# Patient Record
Sex: Female | Born: 1955 | Hispanic: Yes | Marital: Married | Smoking: Never smoker
Health system: Southern US, Community
[De-identification: ages and names within clinical notes are randomized; demographics above are authoritative.]

---

## 2008-06-24 ENCOUNTER — Ambulatory Visit: Payer: Self-pay

## 2009-09-24 ENCOUNTER — Ambulatory Visit: Payer: Self-pay | Admitting: Family Medicine

## 2010-12-20 ENCOUNTER — Ambulatory Visit: Payer: Self-pay | Admitting: Family Medicine

## 2012-03-28 ENCOUNTER — Ambulatory Visit: Payer: Self-pay | Admitting: Family Medicine

## 2013-09-08 ENCOUNTER — Ambulatory Visit: Payer: Self-pay

## 2015-05-19 ENCOUNTER — Ambulatory Visit: Payer: Self-pay | Attending: Oncology

## 2015-05-19 ENCOUNTER — Ambulatory Visit
Admission: RE | Admit: 2015-05-19 | Discharge: 2015-05-19 | Disposition: A | Discharge status: Home / Self Care | Payer: Self-pay | Source: Ambulatory Visit | Attending: Oncology | Admitting: Oncology

## 2015-05-19 VITALS — BP 121/78 | HR 64 | Temp 98.4°F | Ht 62.0 in | Wt 141.0 lb

## 2015-05-19 DIAGNOSIS — Z0000 Encounter for general adult medical examination without abnormal findings: Secondary | ICD-10-CM

## 2015-05-24 LAB — PAP LB AND HPV HIGH-RISK: PAP SMEAR COMMENT: 0

## 2015-05-24 LAB — HPV, LOW VOLUME (REFLEX): HPV, LOW VOL REFLEX: NEGATIVE

## 2015-06-13 NOTE — Progress Notes (Signed)
Letter mailed to patient to notify of normal mammogram, and pap smear results.Patient to return in one year for annual screening.  Copy to [facility]. 

## 2015-06-13 NOTE — Progress Notes (Signed)
Subjective:     Patient ID: Judith Hodges, female   DOB: Aug 05, 1956, 59 y.o.   MRN: [id number]  HPI   Review of Systems     Objective:   Physical Exam  Pulmonary/Chest: Right breast exhibits no inverted nipple, no mass, no nipple discharge, no skin change and no tenderness. Left breast exhibits no inverted nipple, no mass, no nipple discharge, no skin change and no tenderness. Breasts are symmetrical.  Genitourinary: No labial fusion. There is no rash, tenderness, lesion or injury on the right labia. There is no rash, tenderness, lesion or injury on the left labia. Uterus is not deviated, not enlarged, not fixed and not tender. Cervix exhibits no motion tenderness, no discharge and no friability. Right adnexum displays no mass, no tenderness and no fullness. Left adnexum displays no mass, no tenderness and no fullness. No erythema, tenderness or bleeding in the vagina. No foreign body around the vagina. No signs of injury around the vagina. No vaginal discharge found.       Assessment:     59 year old hispanic patient presents for [facility] clinic visitPatient screened, and meets B[facility] eligibility.  Patient does not have insurance, Medicare or Medicaid.  Handout given on Affordable Care Act.CBE unremarkable.  Instructed patient on breast self-exam using teach back method. Pelvic exam normal.    Plan:     Sent for bilateral screening mammogram.  Specimen collected for pap.

## 2015-08-09 DIAGNOSIS — M1812 Unilateral primary osteoarthritis of first carpometacarpal joint, left hand: Secondary | ICD-10-CM | POA: Insufficient documentation

## 2015-08-09 DIAGNOSIS — M65312 Trigger thumb, left thumb: Secondary | ICD-10-CM | POA: Insufficient documentation

## 2015-11-11 ENCOUNTER — Ambulatory Visit (HOSPITAL_BASED_OUTPATIENT_CLINIC_OR_DEPARTMENT_OTHER): Admit: 2015-11-11 | Payer: Self-pay | Admitting: Orthopedic Surgery

## 2015-11-11 ENCOUNTER — Encounter (HOSPITAL_BASED_OUTPATIENT_CLINIC_OR_DEPARTMENT_OTHER): Payer: Self-pay

## 2015-11-11 SURGERY — RELEASE, A1 PULLEY, FOR TRIGGER FINGER
Anesthesia: Regional | Site: Thumb | Laterality: Left

## 2016-08-09 ENCOUNTER — Ambulatory Visit
Admission: RE | Admit: 2016-08-09 | Discharge: 2016-08-09 | Disposition: A | Discharge status: Home / Self Care | Payer: Self-pay | Source: Ambulatory Visit | Attending: Oncology | Admitting: Oncology

## 2016-08-09 ENCOUNTER — Ambulatory Visit: Payer: Self-pay | Attending: Oncology

## 2016-08-09 VITALS — BP 105/65 | HR 72 | Temp 96.7°F | Resp 18 | Ht 62.99 in | Wt 141.1 lb

## 2016-08-09 DIAGNOSIS — Z0000 Encounter for general adult medical examination without abnormal findings: Secondary | ICD-10-CM

## 2016-08-09 NOTE — Progress Notes (Signed)
Subjective:     Patient ID: Judith Hodges, female   DOB: August 20, 1956, 60 y.o.   MRN: [id number]  HPI   Review of Systems     Objective:   Physical Exam  Pulmonary/Chest: Right breast exhibits no inverted nipple, no mass, no nipple discharge, no skin change and no tenderness. Left breast exhibits no inverted nipple, no mass, no nipple discharge, no skin change and no tenderness. Breasts are symmetrical.       Assessment:    60 year old hispanic patient presents for [facility] clinic visit.  Patient screened, and meets [facility] eligibility.  Patient does not have insurance, Medicare or Medicaid.  Handout given on Affordable Care Act.  Instructed patient on breast self-exam using teach back method.  CBE unremarkable.  No mass or lump palpated.  Judith Hodges interpreted exam.    Plan:     Sent for bilateral screening mammogram.

## 2016-08-11 NOTE — Progress Notes (Signed)
[facility] notify of normal mamm[facility] results.  Patient to return in one year for annual screening.  Copy to [facility].Copy to [facility].

## 2016-10-30 IMAGING — MG MM DIGITAL SCREENING BILAT W/ CAD
4 series · 4 of 4 positions shown · non-contrast
Comparison: Previous exam(s).

CLINICAL DATA: Screening.

EXAM:
DIGITAL SCREENING BILATERAL MAMMOGRAM WITH CAD

[R CC]
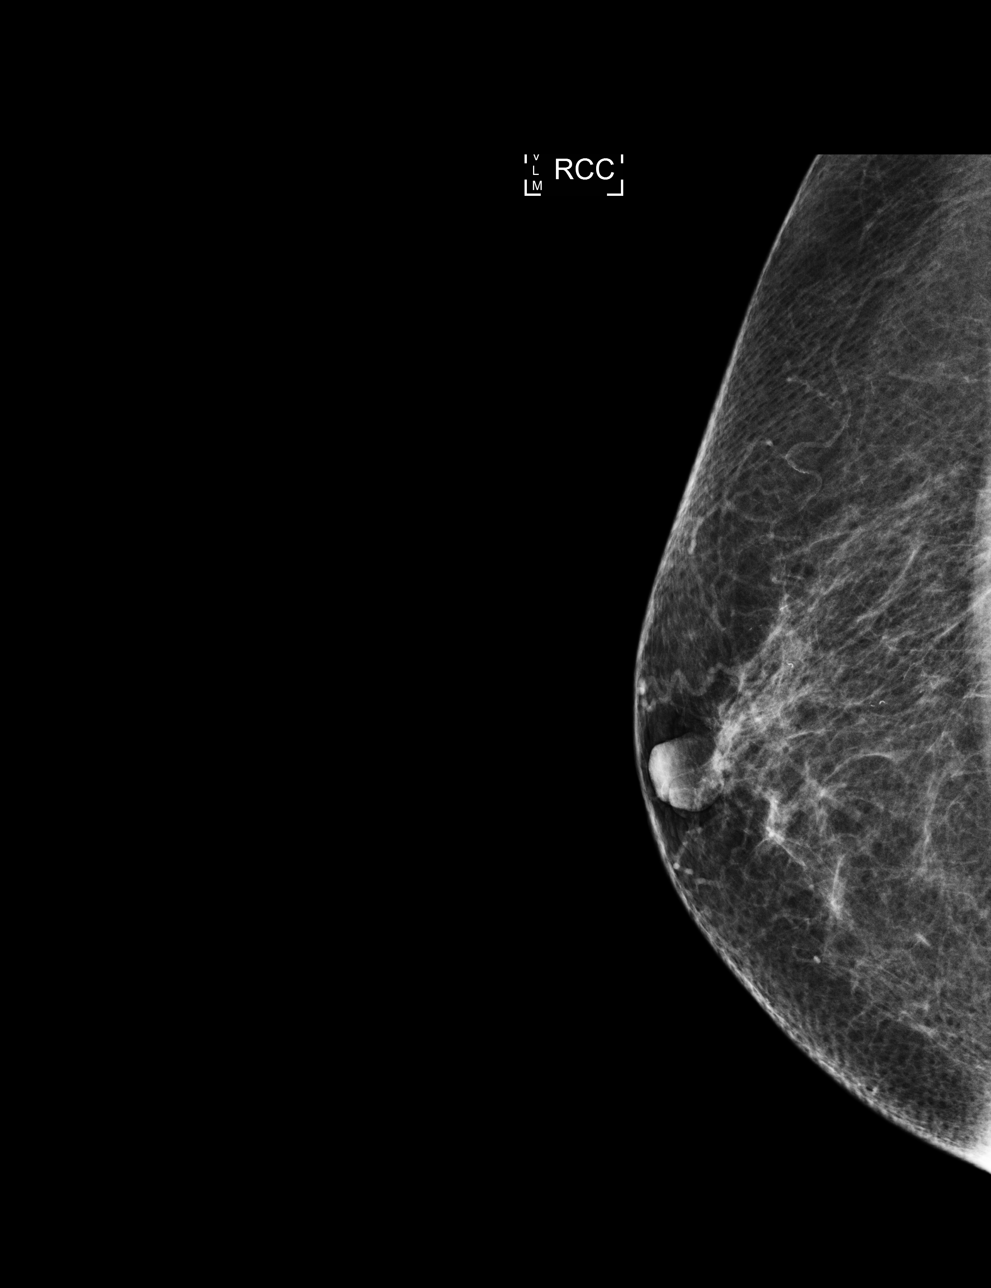

[L MLO]
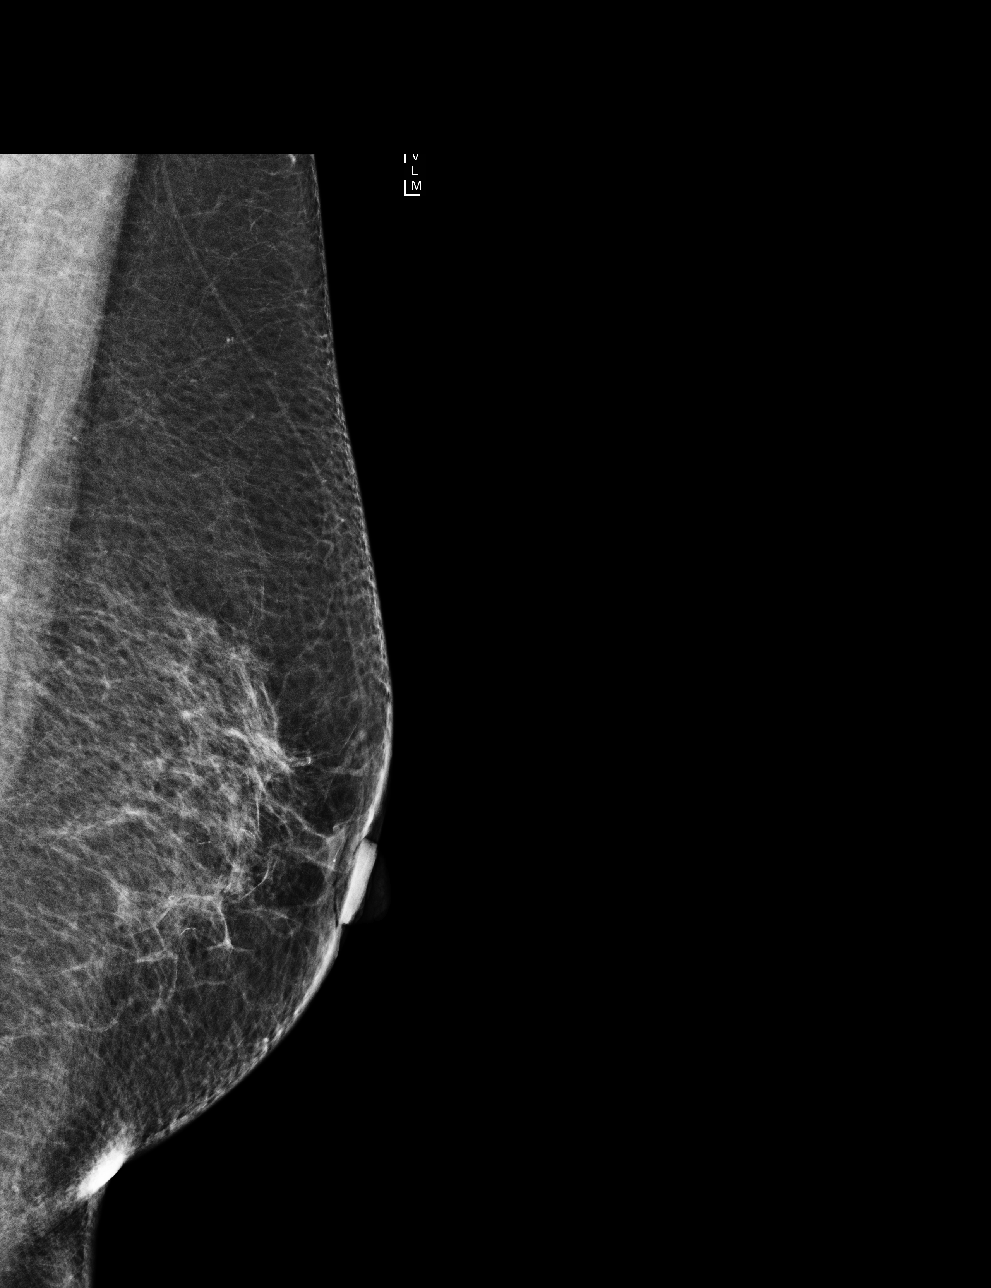

[L CC]
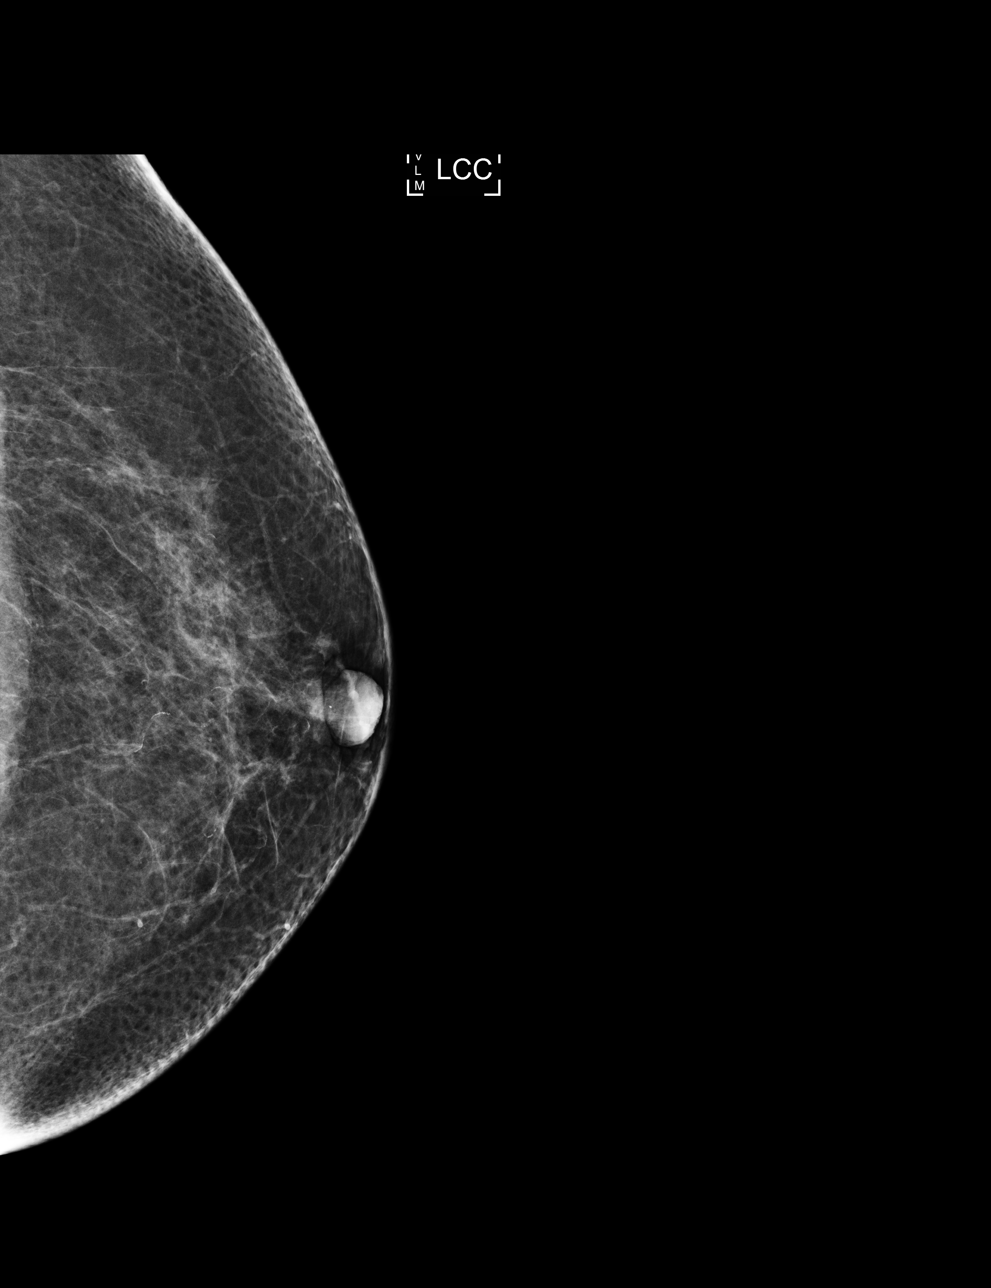

[R MLO]
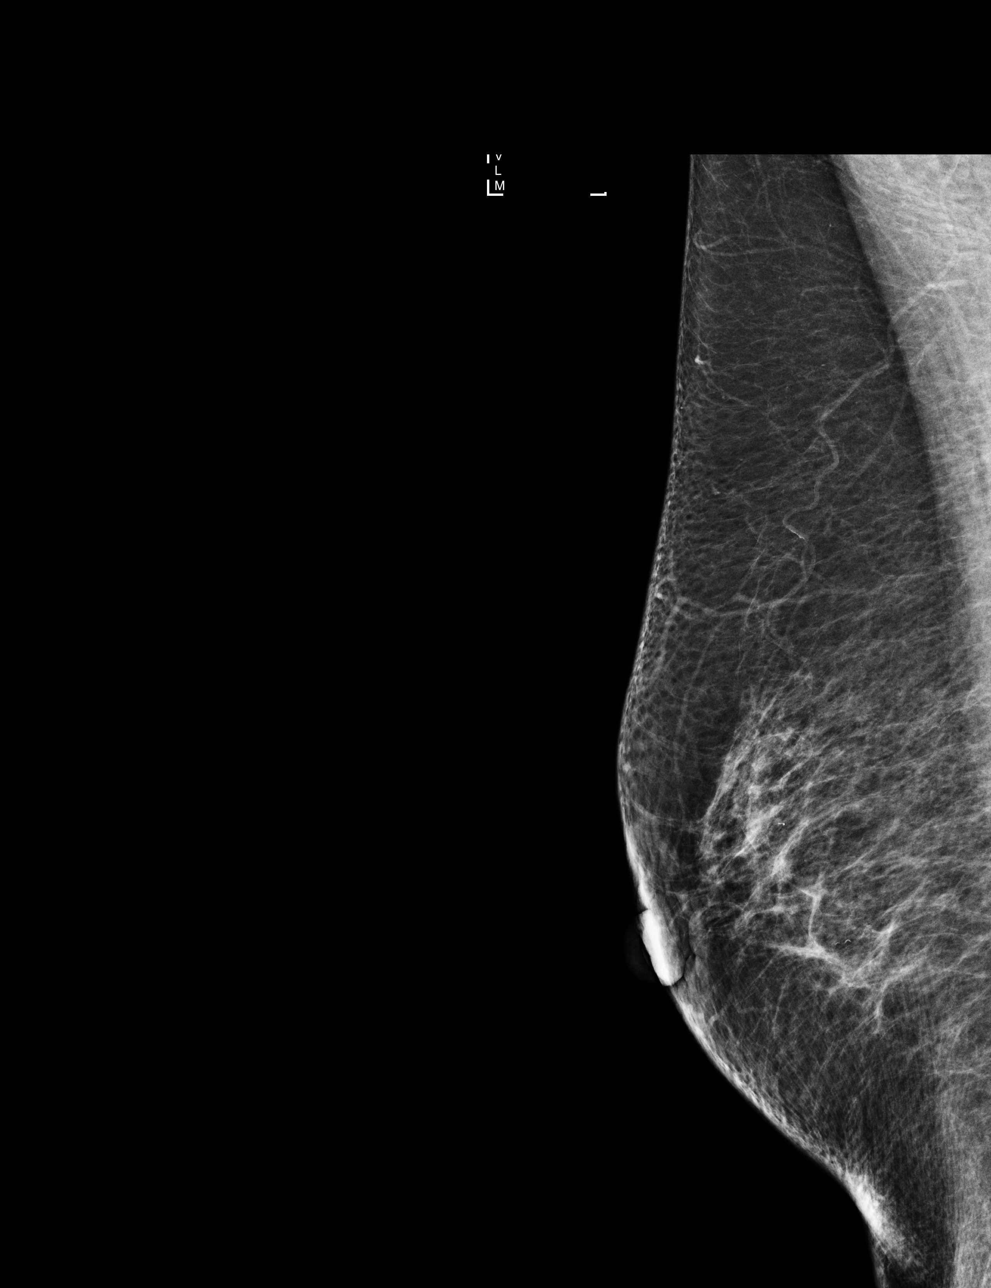

[4 of 4 positions shown; findings below may reference images not displayed]

ACR Breast Density Category b: There are scattered areas of
fibroglandular density.
FINDINGS: There are no findings suspicious for malignancy. Images were
processed with CAD.
IMPRESSION: No mammographic evidence of malignancy. A result letter of this
screening mammogram will be mailed directly to the patient.

RECOMMENDATION:
Screening mammogram in one year. (Code:AS-G-LCT)

BI-RADS CATEGORY  1: Negative.

## 2017-10-03 ENCOUNTER — Ambulatory Visit: Payer: Self-pay

## 2017-10-03 ENCOUNTER — Encounter: Payer: Self-pay | Admitting: *Deleted

## 2017-10-03 ENCOUNTER — Ambulatory Visit
Admission: RE | Admit: 2017-10-03 | Discharge: 2017-10-03 | Disposition: A | Discharge status: Home / Self Care | Payer: Self-pay | Source: Ambulatory Visit | Attending: Oncology | Admitting: Oncology

## 2017-10-03 ENCOUNTER — Ambulatory Visit: Payer: Self-pay | Attending: Oncology | Admitting: *Deleted

## 2017-10-03 VITALS — BP 107/58 | HR 72 | Temp 96.5°F | Resp 18 | Ht 64.0 in | Wt 133.0 lb

## 2017-10-03 DIAGNOSIS — Z0000 Encounter for general adult medical examination without abnormal findings: Secondary | ICD-10-CM

## 2017-10-03 NOTE — Patient Instructions (Signed)
Gave patient hand-out, Women Staying Healthy, Active and Well from [facility], with education on breast health, pap smears, heart and colon health. 

## 2017-10-03 NOTE — Progress Notes (Signed)
Subjective:     Patient ID: Judith Hodges, female   DOB: [birth year], 62 y.o.   MRN: [id number]  HPI   Review of Systems     Objective:   Physical Exam  Pulmonary/Chest: Right breast exhibits no inverted nipple, no mass, no nipple discharge, no skin change and no tenderness. Left breast exhibits no inverted nipple, no mass, no nipple discharge, no skin change and no tenderness. Breasts are symmetrical.       Assessment:     62 [facility] Hispanic [facility]  Suella GroveAlisa Herrera, the interpreter present during the interview and exam.  Clinical breast exam unremarkable.  Taught self breast awareness.  Patient has been screened for eligibility.  She does not have any insurance, Medicare or Medicaid.  She also meets financial eligibility.  Hand-out given on the Affordable Care Act.    Plan:     Screening mammogram ordered.  Will follow-up per [facility] protocol.

## 2017-10-03 NOTE — Progress Notes (Signed)
Letter mailed from the [facility] to inform patient of her normal mammogram results.  Patient is to follow-up with annual screening in one year.  [facility] to [facility]y. 

## 2019-03-17 IMAGING — MG MM DIGITAL SCREENING BILAT W/ TOMO W/ CAD
8 of 11 series · 9 of 27 positions shown · non-contrast
Comparison: Previous exam(s).

CLINICAL DATA: Screening.

EXAM:
DIGITAL SCREENING BILATERAL MAMMOGRAM WITH TOMO AND CAD

[R CC synth-2D]
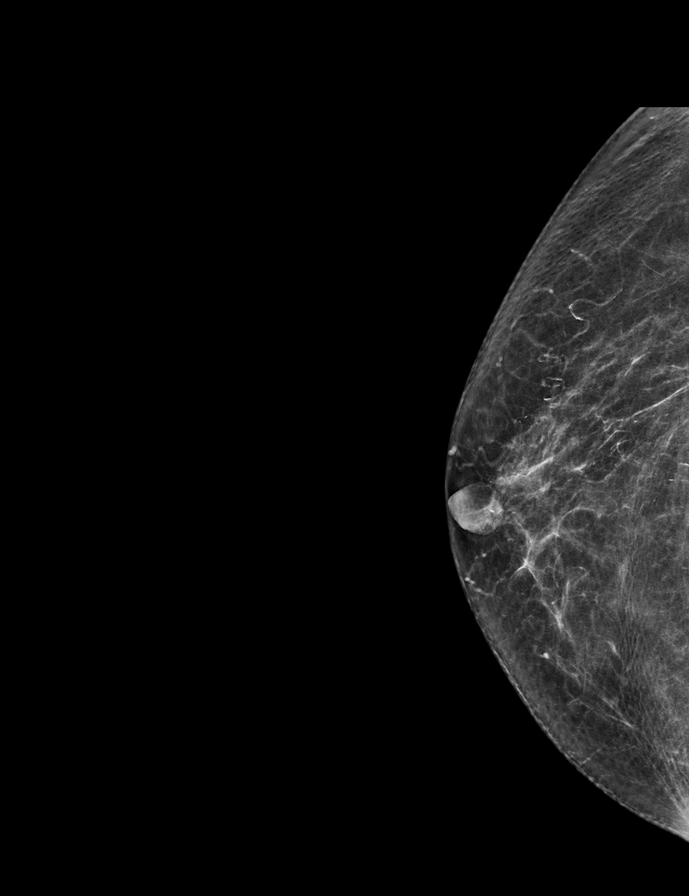

[L MLO]
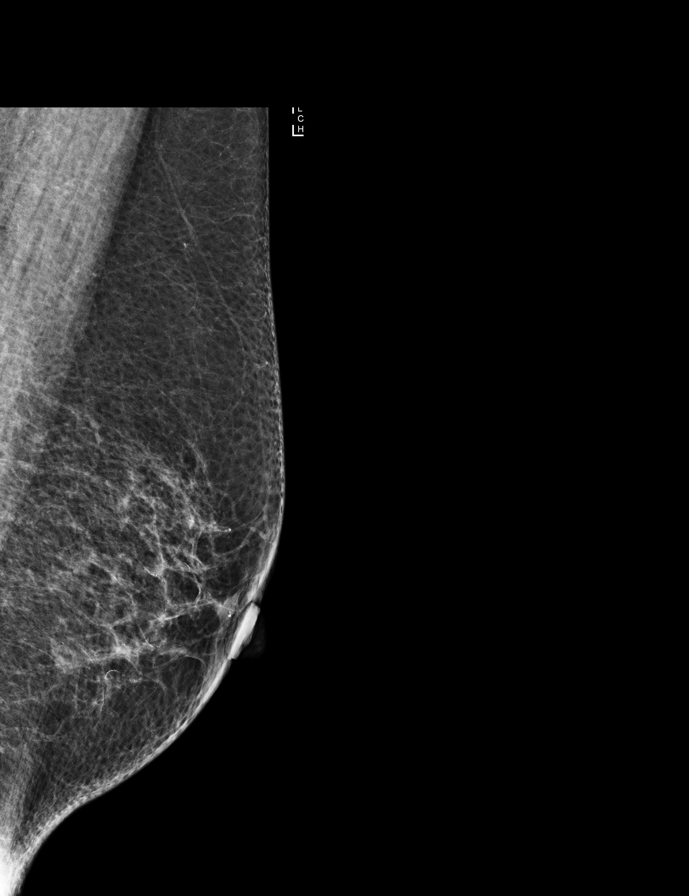

[R MLO]
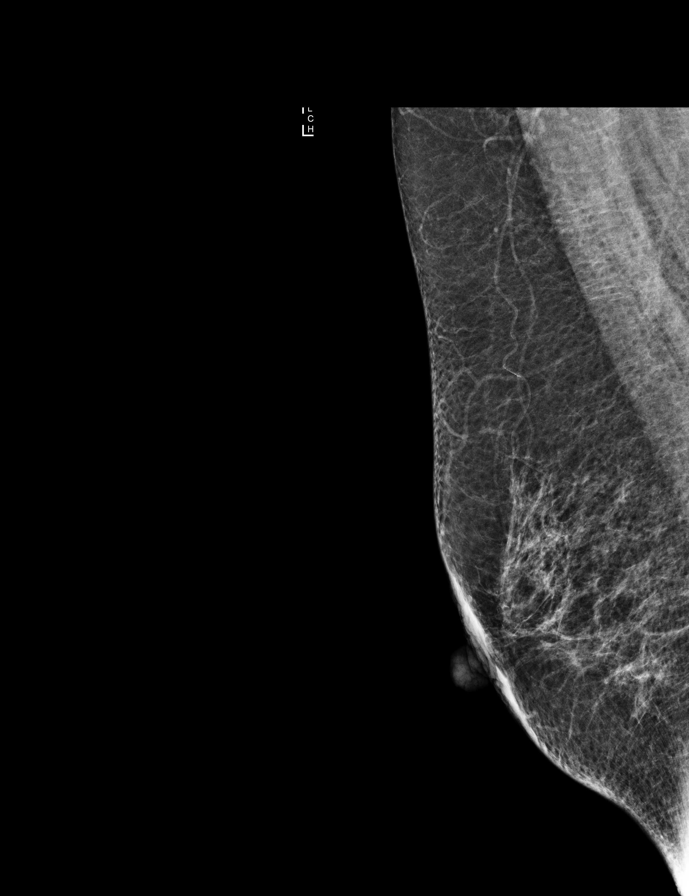

[L CC]
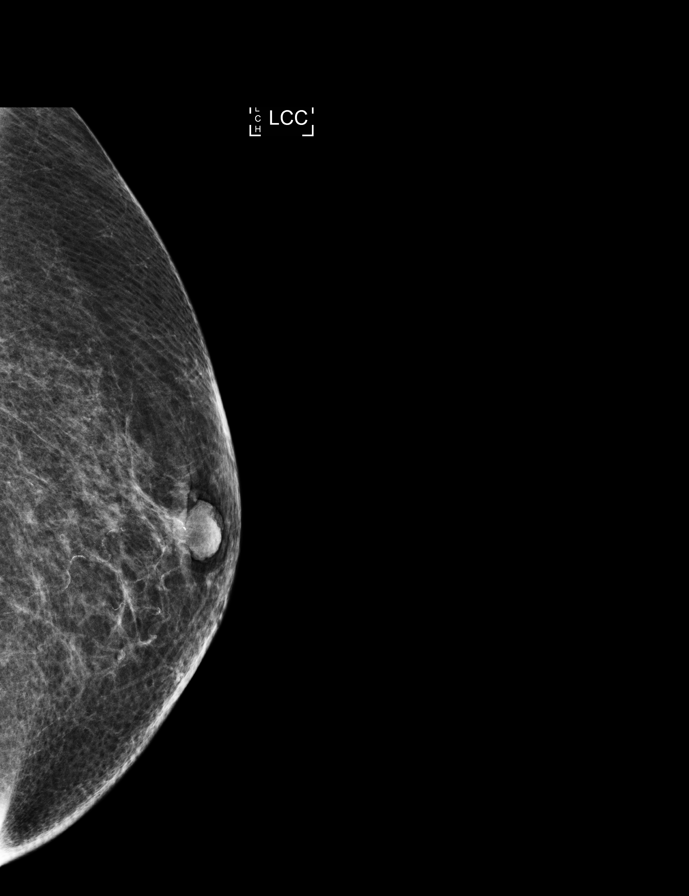

[R CC]
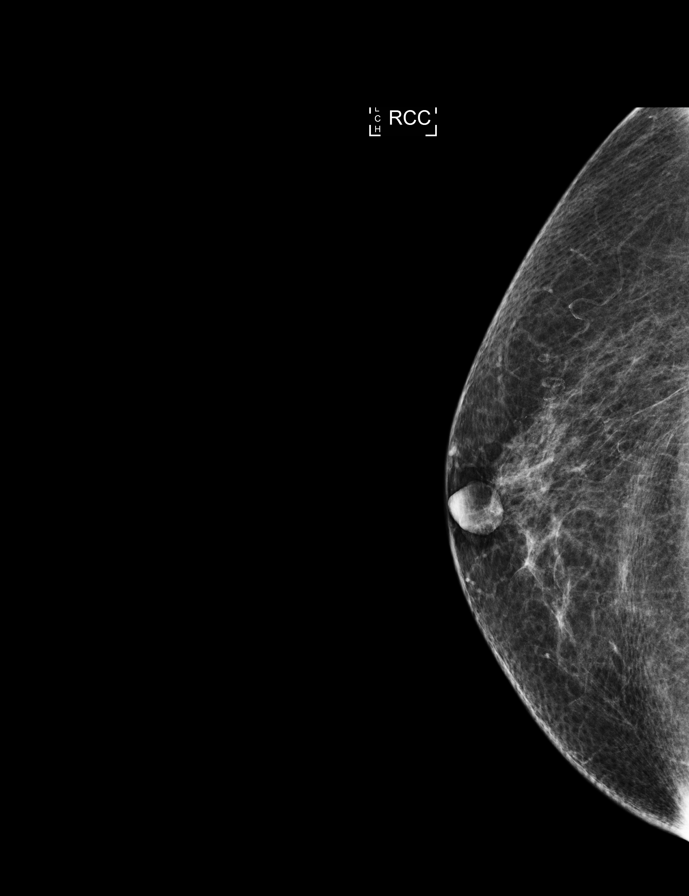

[L MLO synth-2D]
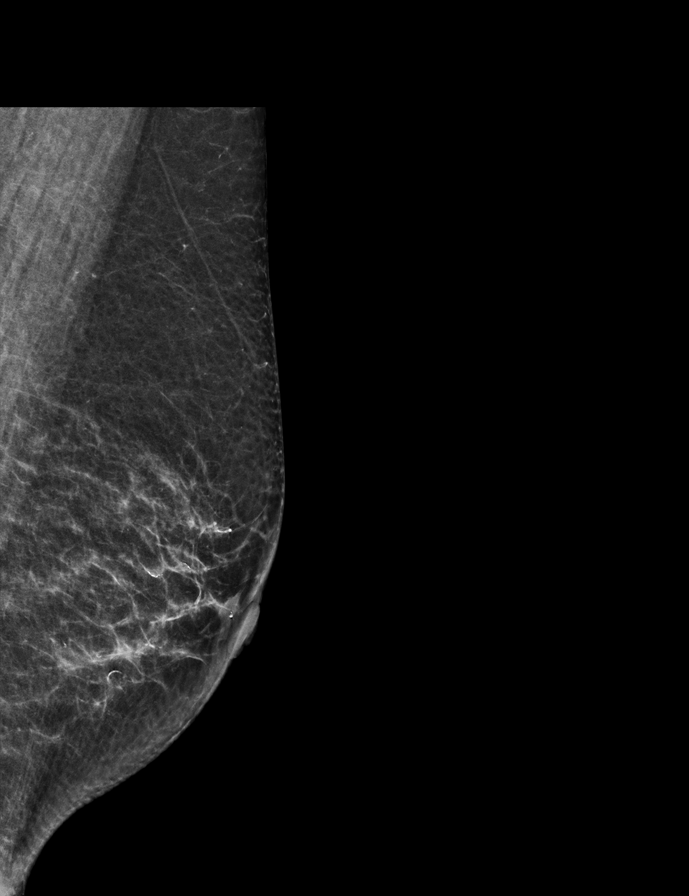

[L CC synth-2D]
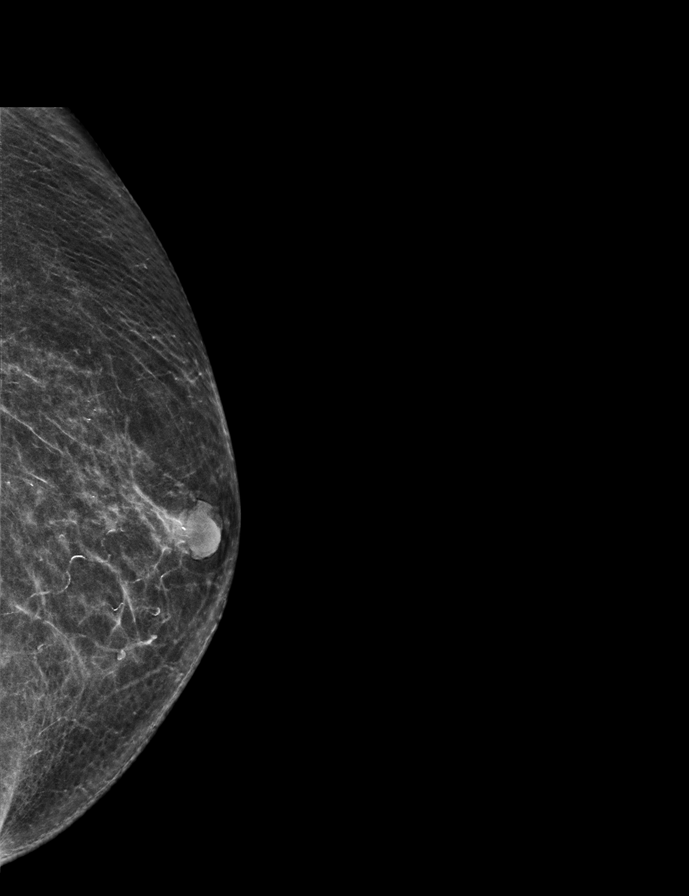

[R CC tomo · 2 of 66 frames shown]
[frame 22/66]
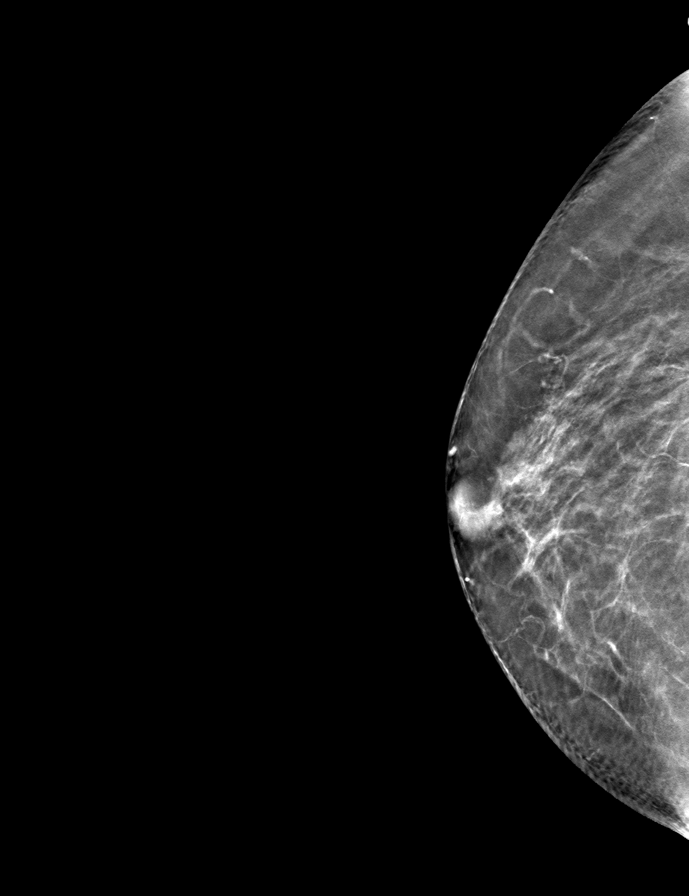
[frame 33/66]
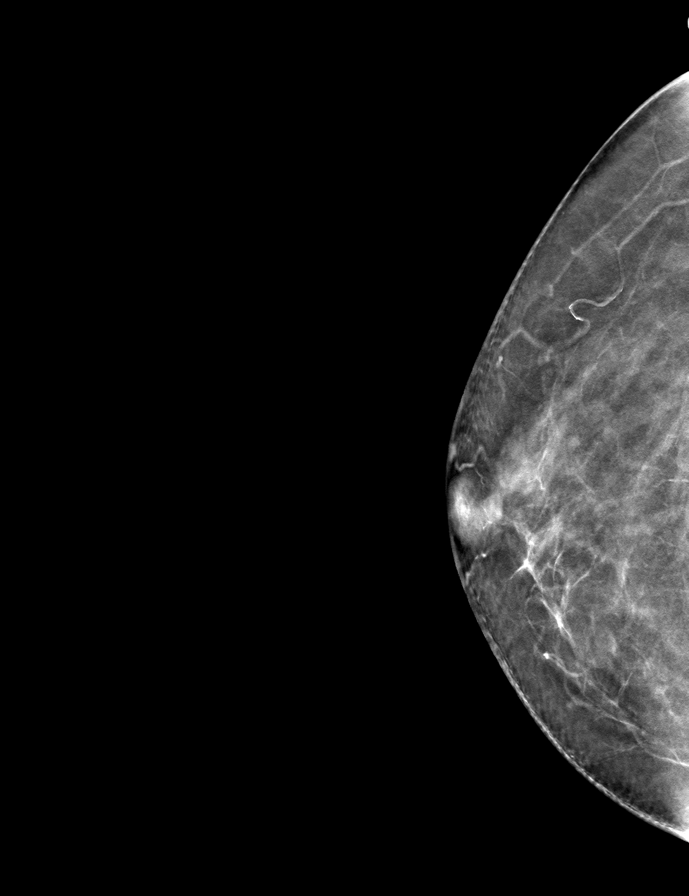

[9 of 27 positions shown; findings below may reference images not displayed]

ACR Breast Density Category b: There are scattered areas of
fibroglandular density.
FINDINGS: There are no findings suspicious for malignancy. Images were
processed with CAD.
IMPRESSION: No mammographic evidence of malignancy. A result letter of this
screening mammogram will be mailed directly to the patient.

RECOMMENDATION:
Screening mammogram in one year. (Code:[id number])

BI-RADS CATEGORY  1: Negative.

## 2019-08-25 ENCOUNTER — Other Ambulatory Visit: Payer: Self-pay

## 2019-08-25 ENCOUNTER — Other Ambulatory Visit: Payer: Self-pay | Admitting: *Deleted

## 2019-08-25 DIAGNOSIS — Z0000 Encounter for general adult medical examination without abnormal findings: Secondary | ICD-10-CM

## 2019-08-26 ENCOUNTER — Encounter: Payer: Self-pay | Admitting: *Deleted

## 2019-08-26 ENCOUNTER — Ambulatory Visit: Payer: Self-pay | Attending: Oncology | Admitting: *Deleted

## 2019-08-26 ENCOUNTER — Ambulatory Visit
Admission: RE | Admit: 2019-08-26 | Discharge: 2019-08-26 | Disposition: A | Discharge status: Home / Self Care | Payer: Self-pay | Source: Ambulatory Visit | Attending: Oncology | Admitting: Oncology

## 2019-08-26 DIAGNOSIS — Z0000 Encounter for general adult medical examination without abnormal findings: Secondary | ICD-10-CM

## 2019-08-26 NOTE — Progress Notes (Addendum)
Due to Covid 19 pandemic a televisit was completed to enroll patient into our [facility] program and complete her health history.  Verbal consent given to [facility].  Patient denies any breast problems.  Last pap on 04/02/17 was negative without HPV co-testing.  Next pap due in August of 2021.  Patient presented directly to the [facility] today for her screening mammogram.  Will follow up per [facility] protocol.  See Dondra Spry breast cancer risk assessement below:  Risk Assessment    No risk assessment data for the current encounter   Risk Scores      08/25/2019   Last edited by: Alta Corning, CMA   5-year risk: 0.6 %   Lifetime risk: 3.3 %

## 2019-08-27 ENCOUNTER — Encounter: Payer: Self-pay | Admitting: *Deleted

## 2019-08-27 NOTE — Progress Notes (Signed)
Letter mailed from the [facility] to inform patient of her normal mammogram results.  Patient is to follow-up with annual screening in one year. 

## 2019-12-05 ENCOUNTER — Ambulatory Visit: Payer: Self-pay | Attending: Family Medicine

## 2020-08-24 NOTE — Progress Notes (Unsigned)
A televisit was used to enroll patient into the [facility] program.  Maryjane Hurter , the interpreter used for interpretation.  2 identifiers were used to confirm I was speaking to the correct patient.   Verbal consent given to enroll int B[facility]cp and health history obtained.  Patient denied any breast problems.  Last pap per patient was 6 months ago at the [facility]  Patient is to present directly to the [facility] for her mammogram on 08/25/20 @ 2:30.

## 2020-08-25 ENCOUNTER — Other Ambulatory Visit: Payer: Self-pay

## 2020-08-25 ENCOUNTER — Ambulatory Visit
Admission: RE | Admit: 2020-08-25 | Discharge: 2020-08-25 | Disposition: A | Discharge status: Home / Self Care | Payer: Self-pay | Source: Ambulatory Visit | Attending: Oncology | Admitting: Oncology

## 2020-08-25 ENCOUNTER — Ambulatory Visit: Payer: Self-pay | Attending: Oncology | Admitting: *Deleted

## 2020-08-25 DIAGNOSIS — Z0000 Encounter for general adult medical examination without abnormal findings: Secondary | ICD-10-CM

## 2020-08-26 ENCOUNTER — Encounter: Payer: Self-pay | Admitting: *Deleted

## 2020-09-02 ENCOUNTER — Encounter: Payer: Self-pay | Admitting: *Deleted

## 2020-09-02 NOTE — Progress Notes (Signed)
Letter mailed from the [facility] to inform patient of her normal mammogram results.  Patient is to follow-up with annual screening in one year. 

## 2021-10-26 ENCOUNTER — Other Ambulatory Visit: Payer: Self-pay

## 2021-10-26 DIAGNOSIS — Z1231 Encounter for screening mammogram for malignant neoplasm of breast: Secondary | ICD-10-CM

## 2021-10-26 NOTE — Addendum Note (Signed)
Addended by: Demetrius Revel on: 10/26/2021 10:19 AM ? ? Modules accepted: Orders ? ?

## 2021-11-03 ENCOUNTER — Telehealth: Payer: Self-pay | Admitting: *Deleted

## 2021-11-08 ENCOUNTER — Other Ambulatory Visit: Payer: Self-pay

## 2021-11-08 DIAGNOSIS — Z1211 Encounter for screening for malignant neoplasm of colon: Secondary | ICD-10-CM

## 2021-11-09 ENCOUNTER — Ambulatory Visit: Payer: Self-pay

## 2022-03-01 ENCOUNTER — Ambulatory Visit: Payer: Self-pay | Attending: Hematology and Oncology

## 2022-03-03 ENCOUNTER — Encounter: Payer: Self-pay | Admitting: Family Medicine

## 2022-03-14 ENCOUNTER — Encounter: Payer: Self-pay | Admitting: Family Medicine

## 2022-04-28 ENCOUNTER — Other Ambulatory Visit: Payer: Self-pay

## 2022-04-28 DIAGNOSIS — Z1231 Encounter for screening mammogram for malignant neoplasm of breast: Secondary | ICD-10-CM

## 2022-05-02 ENCOUNTER — Ambulatory Visit
Admission: RE | Admit: 2022-05-02 | Discharge: 2022-05-02 | Disposition: A | Discharge status: Home / Self Care | Payer: Self-pay | Source: Ambulatory Visit | Attending: Obstetrics and Gynecology | Admitting: Obstetrics and Gynecology

## 2022-05-02 ENCOUNTER — Ambulatory Visit: Payer: Self-pay | Attending: Hematology and Oncology | Admitting: *Deleted

## 2022-05-02 VITALS — BP 105/55 | Wt 131.9 lb

## 2022-05-02 DIAGNOSIS — Z01419 Encounter for gynecological examination (general) (routine) without abnormal findings: Secondary | ICD-10-CM

## 2022-05-02 DIAGNOSIS — Z1231 Encounter for screening mammogram for malignant neoplasm of breast: Secondary | ICD-10-CM | POA: Insufficient documentation

## 2022-05-02 NOTE — Progress Notes (Signed)
Judith Hodges is a 66 y.o. female who presents to [facility] clinic today with no complaints. Patient presents for clinical breast exam and mammogram..    Pap Smear: Pap not smear completed today. Last Pap smear was on 05/31/20 at the [facility] clinic and was negative without HPV co-testing.  Next pap due in 2024.  Per patient has no history of an abnormal Pap smear. Last Pap smear result is available in Epic.   Physical exam: Breasts Breasts symmetrical. No skin abnormalities bilateral breasts. No nipple retraction bilateral breasts. No nipple discharge bilateral breasts. No lymphadenopathy. No lumps palpated bilateral breasts.       Pelvic/Bimanual Pap is not indicated today    Smoking History: Patient has never smoked    Patient Navigation: Patient education provided.  Taught self breast awareness.  Access to services provided for patient through [facility] program. Kandis Cocking, the [employer] interpreter provided interpretation. No transportation provided   Colorectal Cancer Screening: Per patient has never had colonoscopy completed .  Patient had a negative FIT test at the [facility] on 03/09/22.  No complaints today.    Breast and Cervical Cancer Risk Assessment: Patient does not have family history of breast cancer, known genetic mutations, or radiation treatment to the chest before age 50. Patient does not have history of cervical dysplasia, immunocompromised, or DES exposure in-utero.  Risk Assessment     Risk Scores       05/02/2022 08/25/2020   Last edited by: Narda Rutherford, LPN Jim Like, RN   5-year risk: 0.7 % 0.6 %   Lifetime risk: 3 % 3.2 %            A: [facility] exam without pap smear  P: Referred patient to the [facility] of for a screening mammogram. Appointment scheduled today.  Will follow up per [facility] protocol.  Jim Like, RN 05/02/2022 3:03 PM

## 2022-05-15 ENCOUNTER — Other Ambulatory Visit: Payer: Self-pay

## 2022-05-15 NOTE — Progress Notes (Signed)
Error

## 2023-04-19 ENCOUNTER — Other Ambulatory Visit: Payer: Self-pay | Admitting: Family Medicine

## 2023-04-19 DIAGNOSIS — Z1231 Encounter for screening mammogram for malignant neoplasm of breast: Secondary | ICD-10-CM

## 2023-05-04 ENCOUNTER — Ambulatory Visit
Admission: RE | Admit: 2023-05-04 | Discharge: 2023-05-04 | Disposition: A | Discharge status: Home / Self Care | Payer: Medicare Other | Source: Ambulatory Visit | Attending: Family Medicine | Admitting: Family Medicine

## 2023-05-04 DIAGNOSIS — Z1231 Encounter for screening mammogram for malignant neoplasm of breast: Secondary | ICD-10-CM | POA: Insufficient documentation

## 2023-11-09 ENCOUNTER — Other Ambulatory Visit: Payer: Self-pay | Admitting: Family Medicine

## 2023-11-09 DIAGNOSIS — Z780 Asymptomatic menopausal state: Secondary | ICD-10-CM

## 2024-07-01 ENCOUNTER — Other Ambulatory Visit: Payer: Self-pay | Admitting: Family Medicine

## 2024-07-01 DIAGNOSIS — Z1231 Encounter for screening mammogram for malignant neoplasm of breast: Secondary | ICD-10-CM

## 2024-08-26 ENCOUNTER — Encounter

## 2024-09-18 ENCOUNTER — Ambulatory Visit
Admission: RE | Admit: 2024-09-18 | Discharge: 2024-09-18 | Disposition: A | Discharge status: Home / Self Care | Source: Ambulatory Visit | Attending: Family Medicine | Admitting: Family Medicine

## 2024-09-18 DIAGNOSIS — Z1231 Encounter for screening mammogram for malignant neoplasm of breast: Secondary | ICD-10-CM | POA: Insufficient documentation
